# Patient Record
Sex: Female | Born: 1935 | Race: White | Hispanic: No | Marital: Married | Smoking: Former smoker
Health system: Southern US, Community
[De-identification: ages and names within clinical notes are randomized; demographics above are authoritative.]

## PROBLEM LIST (undated history)

## (undated) DIAGNOSIS — I358 Other nonrheumatic aortic valve disorders: Secondary | ICD-10-CM

## (undated) DIAGNOSIS — I4891 Unspecified atrial fibrillation: Secondary | ICD-10-CM

## (undated) DIAGNOSIS — I340 Nonrheumatic mitral (valve) insufficiency: Secondary | ICD-10-CM

## (undated) DIAGNOSIS — R9389 Abnormal findings on diagnostic imaging of other specified body structures: Secondary | ICD-10-CM

## (undated) DIAGNOSIS — IMO0002 Reserved for concepts with insufficient information to code with codable children: Secondary | ICD-10-CM

## (undated) DIAGNOSIS — Z9049 Acquired absence of other specified parts of digestive tract: Secondary | ICD-10-CM

## (undated) DIAGNOSIS — I10 Essential (primary) hypertension: Secondary | ICD-10-CM

## (undated) DIAGNOSIS — M069 Rheumatoid arthritis, unspecified: Secondary | ICD-10-CM

## (undated) DIAGNOSIS — S065X9A Traumatic subdural hemorrhage with loss of consciousness of unspecified duration, initial encounter: Secondary | ICD-10-CM

## (undated) DIAGNOSIS — I351 Nonrheumatic aortic (valve) insufficiency: Secondary | ICD-10-CM

## (undated) DIAGNOSIS — R9430 Abnormal result of cardiovascular function study, unspecified: Secondary | ICD-10-CM

## (undated) DIAGNOSIS — M359 Systemic involvement of connective tissue, unspecified: Secondary | ICD-10-CM

## (undated) DIAGNOSIS — S065XAA Traumatic subdural hemorrhage with loss of consciousness status unknown, initial encounter: Secondary | ICD-10-CM

## (undated) DIAGNOSIS — E039 Hypothyroidism, unspecified: Secondary | ICD-10-CM

## (undated) DIAGNOSIS — I071 Rheumatic tricuspid insufficiency: Secondary | ICD-10-CM

## (undated) DIAGNOSIS — Z7901 Long term (current) use of anticoagulants: Secondary | ICD-10-CM

## (undated) HISTORY — PX: HAND SURGERY: SHX662

## (undated) HISTORY — DX: Unspecified atrial fibrillation: I48.91

## (undated) HISTORY — DX: Nonrheumatic mitral (valve) insufficiency: I34.0

## (undated) HISTORY — DX: Rheumatoid arthritis, unspecified: M06.9

## (undated) HISTORY — DX: Long term (current) use of anticoagulants: Z79.01

## (undated) HISTORY — DX: Rheumatic tricuspid insufficiency: I07.1

## (undated) HISTORY — DX: Traumatic subdural hemorrhage with loss of consciousness of unspecified duration, initial encounter: S06.5X9A

## (undated) HISTORY — DX: Nonrheumatic aortic (valve) insufficiency: I35.1

## (undated) HISTORY — DX: Acquired absence of other specified parts of digestive tract: Z90.49

## (undated) HISTORY — DX: Essential (primary) hypertension: I10

## (undated) HISTORY — DX: Other nonrheumatic aortic valve disorders: I35.8

## (undated) HISTORY — DX: Abnormal result of cardiovascular function study, unspecified: R94.30

## (undated) HISTORY — DX: Hypothyroidism, unspecified: E03.9

## (undated) HISTORY — DX: Abnormal findings on diagnostic imaging of other specified body structures: R93.89

## (undated) HISTORY — DX: Traumatic subdural hemorrhage with loss of consciousness status unknown, initial encounter: S06.5XAA

## (undated) HISTORY — PX: SKIN CANCER EXCISION: SHX779

## (undated) HISTORY — PX: TOTAL ABDOMINAL HYSTERECTOMY: SHX209

## (undated) HISTORY — PX: CHOLECYSTECTOMY: SHX55

## (undated) HISTORY — DX: Reserved for concepts with insufficient information to code with codable children: IMO0002

---

## 1998-02-20 ENCOUNTER — Ambulatory Visit (HOSPITAL_COMMUNITY): Admission: RE | Admit: 1998-02-20 | Discharge: 1998-02-20 | Payer: Self-pay | Admitting: *Deleted

## 1999-02-25 ENCOUNTER — Encounter: Payer: Self-pay | Admitting: Family Medicine

## 1999-02-25 ENCOUNTER — Ambulatory Visit (HOSPITAL_COMMUNITY): Admission: RE | Admit: 1999-02-25 | Discharge: 1999-02-25 | Payer: Self-pay | Admitting: Family Medicine

## 1999-03-03 ENCOUNTER — Encounter: Payer: Self-pay | Admitting: Endocrinology

## 1999-03-03 ENCOUNTER — Inpatient Hospital Stay (HOSPITAL_COMMUNITY): Admission: EM | Admit: 1999-03-03 | Discharge: 1999-03-06 | Payer: Self-pay | Admitting: Emergency Medicine

## 1999-03-07 ENCOUNTER — Ambulatory Visit (HOSPITAL_COMMUNITY): Admission: RE | Admit: 1999-03-07 | Discharge: 1999-03-07 | Payer: Self-pay | Admitting: Infectious Diseases

## 1999-04-09 ENCOUNTER — Ambulatory Visit (HOSPITAL_COMMUNITY): Admission: RE | Admit: 1999-04-09 | Discharge: 1999-04-09 | Payer: Self-pay | Admitting: *Deleted

## 1999-09-23 ENCOUNTER — Encounter: Payer: Self-pay | Admitting: Family Medicine

## 1999-09-23 ENCOUNTER — Encounter: Admission: RE | Admit: 1999-09-23 | Discharge: 1999-09-23 | Payer: Self-pay | Admitting: Family Medicine

## 2000-02-27 ENCOUNTER — Ambulatory Visit (HOSPITAL_COMMUNITY): Admission: RE | Admit: 2000-02-27 | Discharge: 2000-02-27 | Payer: Self-pay | Admitting: Family Medicine

## 2000-02-27 ENCOUNTER — Encounter: Payer: Self-pay | Admitting: Family Medicine

## 2001-02-19 ENCOUNTER — Encounter: Payer: Self-pay | Admitting: General Surgery

## 2001-02-22 ENCOUNTER — Encounter: Payer: Self-pay | Admitting: General Surgery

## 2001-02-22 ENCOUNTER — Inpatient Hospital Stay (HOSPITAL_COMMUNITY): Admission: RE | Admit: 2001-02-22 | Discharge: 2001-02-23 | Payer: Self-pay | Admitting: General Surgery

## 2001-02-22 ENCOUNTER — Encounter (INDEPENDENT_AMBULATORY_CARE_PROVIDER_SITE_OTHER): Payer: Self-pay | Admitting: Specialist

## 2001-03-08 ENCOUNTER — Encounter: Payer: Self-pay | Admitting: Family Medicine

## 2001-03-08 ENCOUNTER — Ambulatory Visit (HOSPITAL_COMMUNITY): Admission: RE | Admit: 2001-03-08 | Discharge: 2001-03-08 | Payer: Self-pay | Admitting: Family Medicine

## 2001-08-20 ENCOUNTER — Other Ambulatory Visit: Admission: RE | Admit: 2001-08-20 | Discharge: 2001-08-20 | Payer: Self-pay | Admitting: Family Medicine

## 2001-12-02 ENCOUNTER — Ambulatory Visit (HOSPITAL_COMMUNITY): Admission: RE | Admit: 2001-12-02 | Discharge: 2001-12-02 | Payer: Self-pay | Admitting: General Surgery

## 2001-12-02 ENCOUNTER — Encounter (INDEPENDENT_AMBULATORY_CARE_PROVIDER_SITE_OTHER): Payer: Self-pay

## 2002-03-18 ENCOUNTER — Encounter: Payer: Self-pay | Admitting: Family Medicine

## 2002-03-18 ENCOUNTER — Ambulatory Visit (HOSPITAL_COMMUNITY): Admission: RE | Admit: 2002-03-18 | Discharge: 2002-03-18 | Payer: Self-pay | Admitting: Family Medicine

## 2003-03-20 ENCOUNTER — Encounter: Payer: Self-pay | Admitting: Internal Medicine

## 2003-03-20 ENCOUNTER — Ambulatory Visit (HOSPITAL_COMMUNITY): Admission: RE | Admit: 2003-03-20 | Discharge: 2003-03-20 | Payer: Self-pay | Admitting: Internal Medicine

## 2004-04-17 ENCOUNTER — Ambulatory Visit (HOSPITAL_COMMUNITY): Admission: RE | Admit: 2004-04-17 | Discharge: 2004-04-17 | Payer: Self-pay | Admitting: Internal Medicine

## 2004-05-01 ENCOUNTER — Ambulatory Visit: Payer: Self-pay | Admitting: *Deleted

## 2004-05-09 ENCOUNTER — Ambulatory Visit: Payer: Self-pay | Admitting: Cardiology

## 2004-05-17 ENCOUNTER — Ambulatory Visit: Payer: Self-pay | Admitting: Cardiology

## 2004-05-27 ENCOUNTER — Ambulatory Visit: Payer: Self-pay | Admitting: Internal Medicine

## 2004-06-12 ENCOUNTER — Ambulatory Visit: Payer: Self-pay | Admitting: Cardiology

## 2004-07-03 ENCOUNTER — Ambulatory Visit: Payer: Self-pay | Admitting: Cardiology

## 2004-07-31 ENCOUNTER — Ambulatory Visit: Payer: Self-pay | Admitting: Cardiology

## 2004-08-14 ENCOUNTER — Ambulatory Visit: Payer: Self-pay | Admitting: Cardiovascular Disease

## 2004-09-11 ENCOUNTER — Ambulatory Visit: Payer: Self-pay | Admitting: *Deleted

## 2004-10-07 ENCOUNTER — Ambulatory Visit: Payer: Self-pay | Admitting: Cardiology

## 2004-10-16 ENCOUNTER — Ambulatory Visit: Payer: Self-pay | Admitting: Internal Medicine

## 2004-10-23 ENCOUNTER — Ambulatory Visit: Payer: Self-pay | Admitting: Cardiology

## 2004-10-30 ENCOUNTER — Ambulatory Visit: Payer: Self-pay | Admitting: Cardiology

## 2004-11-07 ENCOUNTER — Ambulatory Visit: Payer: Self-pay | Admitting: Internal Medicine

## 2004-11-07 ENCOUNTER — Ambulatory Visit: Payer: Self-pay | Admitting: Cardiology

## 2004-11-28 ENCOUNTER — Ambulatory Visit: Payer: Self-pay | Admitting: Internal Medicine

## 2004-11-29 ENCOUNTER — Ambulatory Visit: Payer: Self-pay | Admitting: Family Medicine

## 2004-12-04 ENCOUNTER — Ambulatory Visit: Payer: Self-pay | Admitting: Cardiology

## 2004-12-20 ENCOUNTER — Ambulatory Visit: Payer: Self-pay | Admitting: Cardiology

## 2004-12-25 ENCOUNTER — Ambulatory Visit: Payer: Self-pay | Admitting: Cardiology

## 2005-01-08 ENCOUNTER — Ambulatory Visit: Payer: Self-pay | Admitting: Cardiology

## 2005-01-08 ENCOUNTER — Ambulatory Visit: Payer: Self-pay | Admitting: Internal Medicine

## 2005-01-14 ENCOUNTER — Ambulatory Visit: Payer: Self-pay | Admitting: Cardiology

## 2005-01-29 ENCOUNTER — Ambulatory Visit: Payer: Self-pay | Admitting: Internal Medicine

## 2005-01-29 ENCOUNTER — Ambulatory Visit: Payer: Self-pay | Admitting: *Deleted

## 2005-02-26 ENCOUNTER — Ambulatory Visit: Payer: Self-pay | Admitting: *Deleted

## 2005-03-13 ENCOUNTER — Ambulatory Visit: Payer: Self-pay | Admitting: Internal Medicine

## 2005-03-17 ENCOUNTER — Ambulatory Visit: Payer: Self-pay | Admitting: Internal Medicine

## 2005-04-08 ENCOUNTER — Ambulatory Visit: Payer: Self-pay | Admitting: Cardiology

## 2005-04-23 ENCOUNTER — Ambulatory Visit: Payer: Self-pay | Admitting: Cardiology

## 2005-04-28 ENCOUNTER — Ambulatory Visit: Payer: Self-pay | Admitting: Internal Medicine

## 2005-05-06 ENCOUNTER — Ambulatory Visit (HOSPITAL_COMMUNITY): Admission: RE | Admit: 2005-05-06 | Discharge: 2005-05-06 | Payer: Self-pay | Admitting: Internal Medicine

## 2005-05-21 ENCOUNTER — Ambulatory Visit: Payer: Self-pay | Admitting: Cardiology

## 2005-06-20 ENCOUNTER — Ambulatory Visit: Payer: Self-pay | Admitting: Internal Medicine

## 2005-07-21 ENCOUNTER — Ambulatory Visit: Payer: Self-pay | Admitting: Internal Medicine

## 2006-05-29 ENCOUNTER — Ambulatory Visit (HOSPITAL_COMMUNITY): Admission: RE | Admit: 2006-05-29 | Discharge: 2006-05-29 | Payer: Self-pay | Admitting: Obstetrics and Gynecology

## 2007-05-21 ENCOUNTER — Ambulatory Visit: Payer: Self-pay | Admitting: Family Medicine

## 2007-05-21 DIAGNOSIS — N390 Urinary tract infection, site not specified: Secondary | ICD-10-CM | POA: Insufficient documentation

## 2007-05-21 LAB — CONVERTED CEMR LAB
Bilirubin Urine: NEGATIVE
Glucose, Urine, Semiquant: NEGATIVE
Nitrite: NEGATIVE
Protein, U semiquant: 30
Specific Gravity, Urine: 1.025
Urobilinogen, UA: NEGATIVE
pH: 6

## 2007-06-08 ENCOUNTER — Ambulatory Visit (HOSPITAL_COMMUNITY): Admission: RE | Admit: 2007-06-08 | Discharge: 2007-06-08 | Payer: Self-pay | Admitting: Obstetrics and Gynecology

## 2008-06-14 ENCOUNTER — Ambulatory Visit (HOSPITAL_COMMUNITY): Admission: RE | Admit: 2008-06-14 | Discharge: 2008-06-14 | Payer: Self-pay | Admitting: Obstetrics and Gynecology

## 2009-06-15 ENCOUNTER — Encounter: Payer: Self-pay | Admitting: Obstetrics and Gynecology

## 2009-06-15 ENCOUNTER — Emergency Department (HOSPITAL_COMMUNITY): Admission: EM | Admit: 2009-06-15 | Discharge: 2009-06-15 | Payer: Self-pay | Admitting: Family Medicine

## 2009-06-15 ENCOUNTER — Inpatient Hospital Stay (HOSPITAL_COMMUNITY): Admission: EM | Admit: 2009-06-15 | Discharge: 2009-06-19 | Payer: Self-pay | Admitting: Emergency Medicine

## 2009-12-18 IMAGING — CT CT HEAD W/O CM
1 of 2 series · 13 of 30 positions shown, 17 images · non-contrast
Comparison: 06/15/2009.

CLINICAL DATA: 76-year-old female with abruptly decrease mental
status.  Status post fall with subdural hematoma.

CT HEAD WITHOUT CONTRAST
TECHNIQUE: Contiguous axial images were obtained from the base of
the skull through the vertex without contrast.

[Series 2: brain · axial · 0.47mm/px · z∈[+140,+273]mm · 13 of 32 slices shown, 17 images]
[im 3/32  brain]
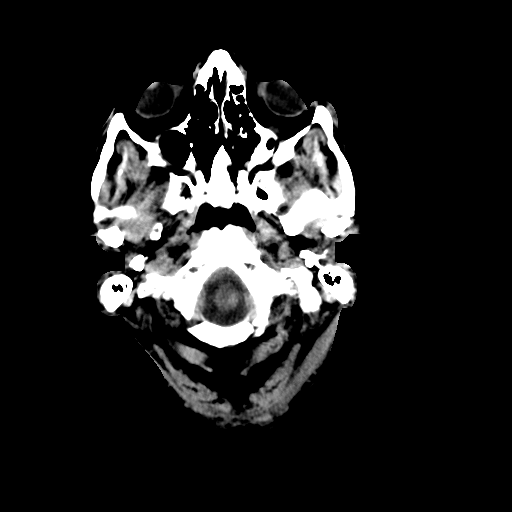
[im 3/32  bone]
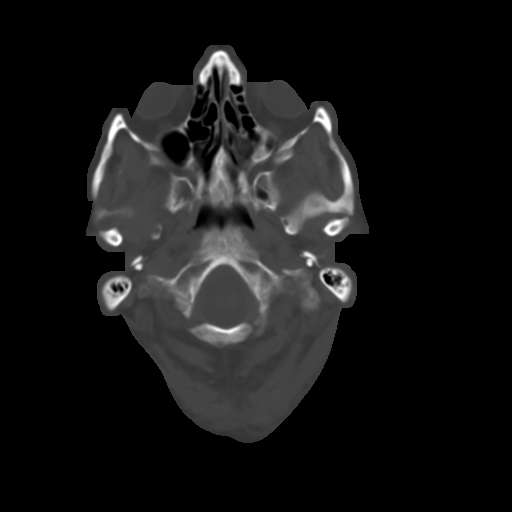
[im 5/32  brain]
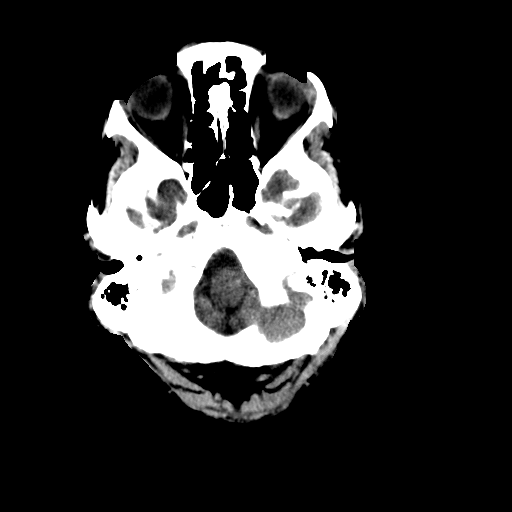
[im 7/32  brain]
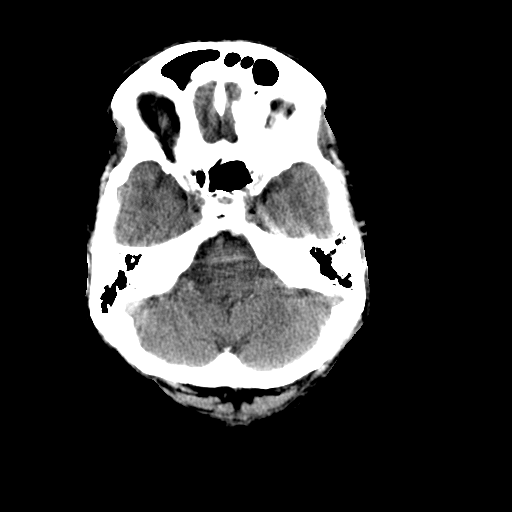
[im 9/32  brain]
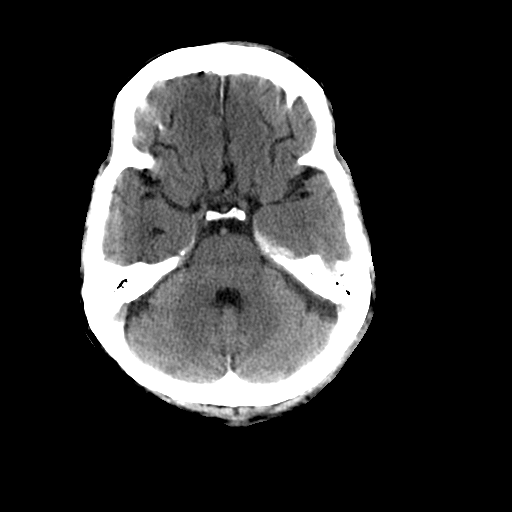
[im 12/32  brain]
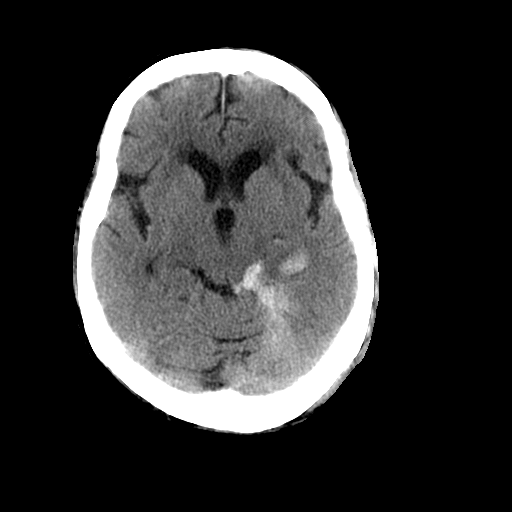
[im 12/32  bone]
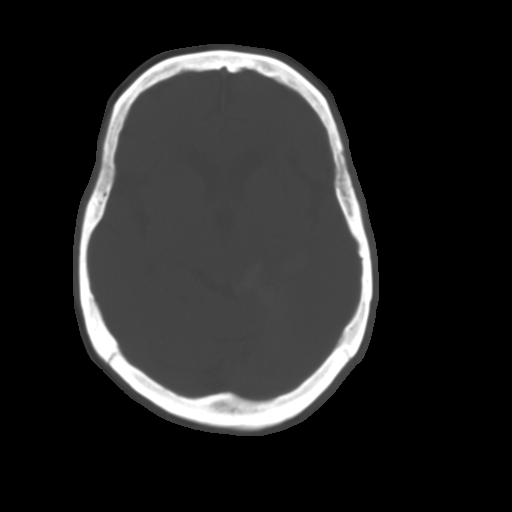
[im 14/32  brain]
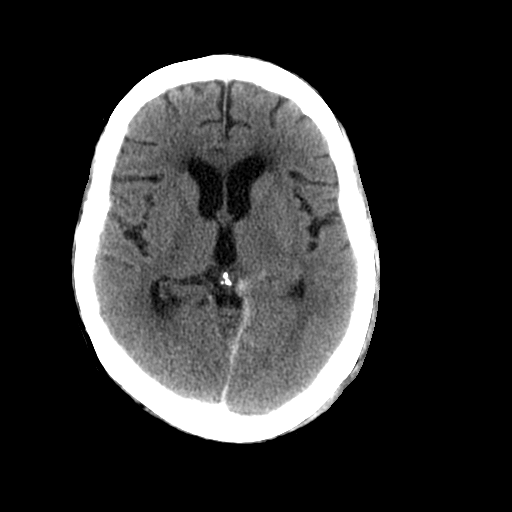
[im 16/32  brain]
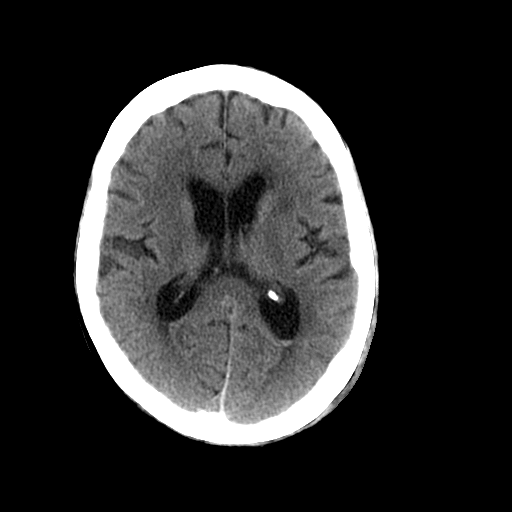
[im 18/32  brain]
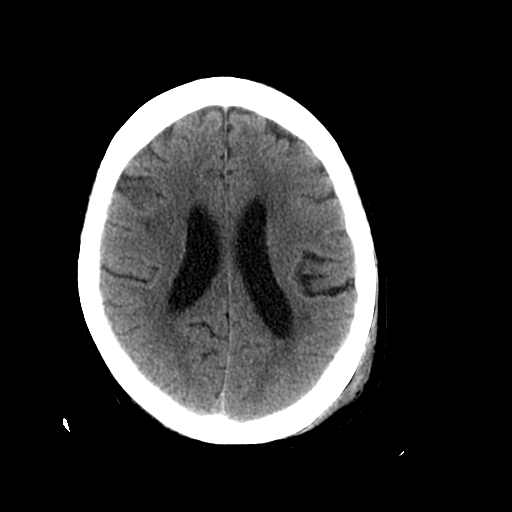
[im 20/32  brain]
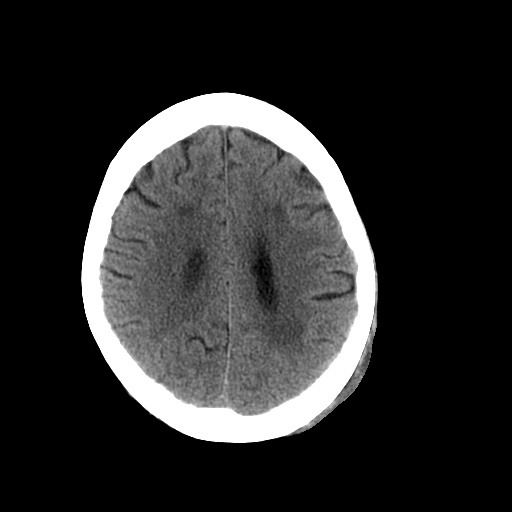
[im 20/32  bone]
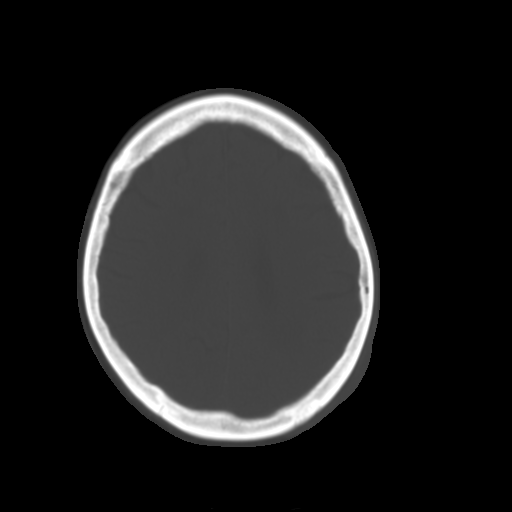
[im 23/32  brain]
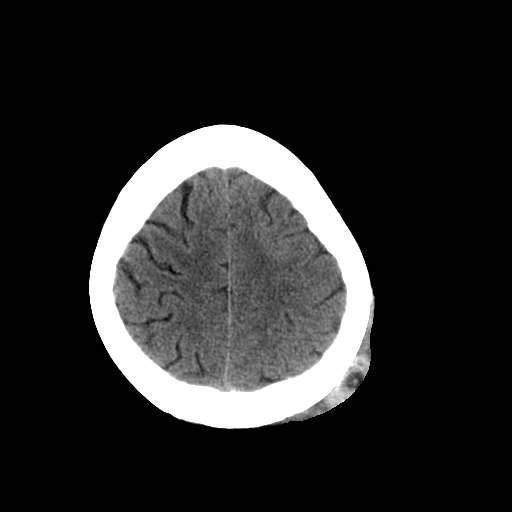
[im 25/32  brain]
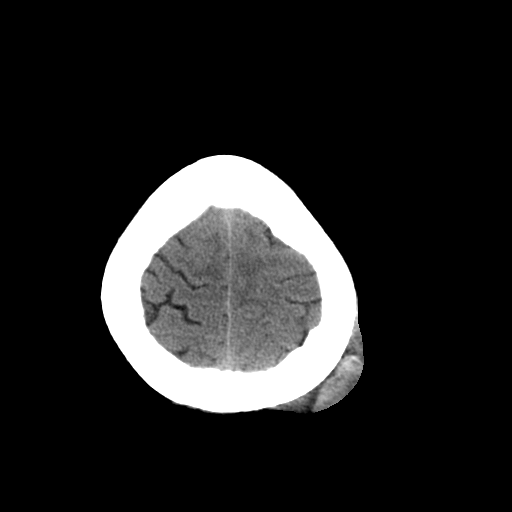
[im 27/32  brain]
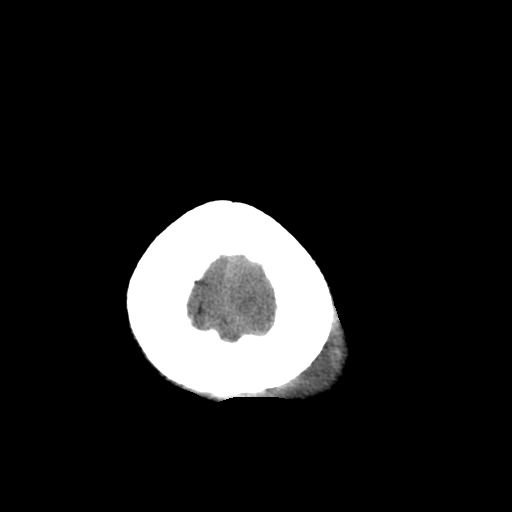
[im 29/32  brain]
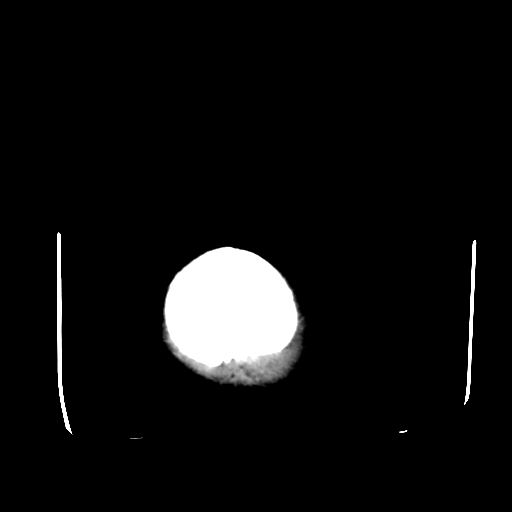
[im 29/32  bone]
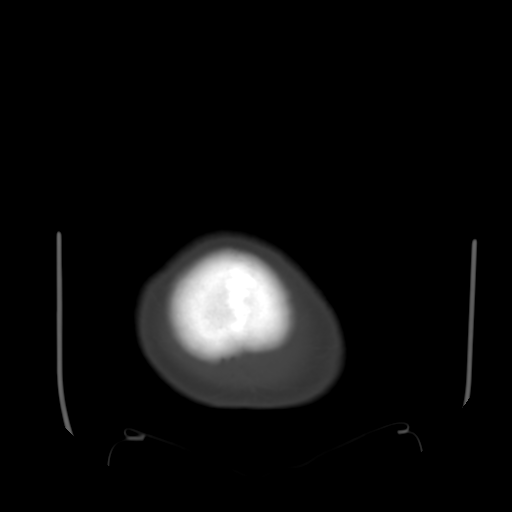

[13 of 30 positions shown; findings below may reference images not displayed]

FINDINGS: Stable multi focal moderate to large left side scalp
hematoma.  Paranasal sinuses and mastoids remain clear. No acute
osseous abnormality identified.

Volume of hyperdense subdural hematoma along the left tentorium,
the medial left middle cranial fossa, and posterior
interhemispheric fissure is not significantly changed.  A small
amount of superimposed subarachnoid hemorrhage is re-identified in
the ambient cisterns (more so the left), with a stable small volume
of intraventricular hemorrhage on the left.  There is superimposed
parenchymal hemorrhage in the left mesial temporal lobe which also
appears not significantly changed.  The latter does have mild
surrounding edema.  No midline shift or significant mass effect.
Stable ventricle size and configuration.  Stable gray-white matter
differentiation throughout the brain.  No evidence of cortically
based acute infarction identified.
IMPRESSION: 1.  No significant interval change. No new abnormality identified.
2.  Left side dominant subdural and subarachnoid hemorrhage with
stable parenchymal hemorrhage and surrounding edema in the mesial
left temporal lobe, and trace intraventricular hemorrhage.
3.  Stable scalp hematomas.

## 2010-07-18 ENCOUNTER — Other Ambulatory Visit (HOSPITAL_COMMUNITY): Payer: Self-pay | Admitting: Obstetrics and Gynecology

## 2010-07-18 DIAGNOSIS — Z0000 Encounter for general adult medical examination without abnormal findings: Secondary | ICD-10-CM

## 2010-07-18 DIAGNOSIS — Z1231 Encounter for screening mammogram for malignant neoplasm of breast: Secondary | ICD-10-CM

## 2010-08-08 ENCOUNTER — Ambulatory Visit (HOSPITAL_COMMUNITY)
Admission: RE | Admit: 2010-08-08 | Discharge: 2010-08-08 | Disposition: A | Discharge status: Home / Self Care | Payer: Medicare Other | Source: Ambulatory Visit | Attending: Obstetrics and Gynecology | Admitting: Obstetrics and Gynecology

## 2010-08-08 ENCOUNTER — Ambulatory Visit (HOSPITAL_COMMUNITY): Admission: RE | Admit: 2010-08-08 | Payer: Self-pay | Source: Home / Self Care | Admitting: Obstetrics and Gynecology

## 2010-08-08 DIAGNOSIS — Z1231 Encounter for screening mammogram for malignant neoplasm of breast: Secondary | ICD-10-CM

## 2010-09-30 LAB — POCT I-STAT, CHEM 8
BUN: 15 mg/dL (ref 6–23)
Calcium, Ion: 0.87 mmol/L — ABNORMAL LOW (ref 1.12–1.32)
Chloride: 105 mEq/L (ref 96–112)
Creatinine, Ser: 0.8 mg/dL (ref 0.4–1.2)
Glucose, Bld: 121 mg/dL — ABNORMAL HIGH (ref 70–99)
HCT: 40 % (ref 36.0–46.0)
Hemoglobin: 13.6 g/dL (ref 12.0–15.0)
Potassium: 3.9 mEq/L (ref 3.5–5.1)
Sodium: 138 mEq/L (ref 135–145)
TCO2: 30 mmol/L (ref 0–100)

## 2010-09-30 LAB — PREPARE FRESH FROZEN PLASMA

## 2010-09-30 LAB — DIFFERENTIAL
Basophils Absolute: 0.1 10*3/uL (ref 0.0–0.1)
Basophils Relative: 1 % (ref 0–1)
Eosinophils Absolute: 0 10*3/uL (ref 0.0–0.7)
Eosinophils Relative: 0 % (ref 0–5)
Lymphocytes Relative: 23 % (ref 12–46)
Lymphs Abs: 1.5 10*3/uL (ref 0.7–4.0)
Monocytes Absolute: 0.8 10*3/uL (ref 0.1–1.0)
Monocytes Relative: 12 % (ref 3–12)
Neutro Abs: 4.2 10*3/uL (ref 1.7–7.7)
Neutrophils Relative %: 64 % (ref 43–77)

## 2010-09-30 LAB — TYPE AND SCREEN
ABO/RH(D): O POS
Antibody Screen: NEGATIVE

## 2010-09-30 LAB — URINALYSIS, ROUTINE W REFLEX MICROSCOPIC
Bilirubin Urine: NEGATIVE
Glucose, UA: NEGATIVE mg/dL
Hgb urine dipstick: NEGATIVE
Ketones, ur: 15 mg/dL — AB
Nitrite: NEGATIVE
Protein, ur: NEGATIVE mg/dL
Specific Gravity, Urine: 1.013 (ref 1.005–1.030)
Urobilinogen, UA: 0.2 mg/dL (ref 0.0–1.0)
pH: 7 (ref 5.0–8.0)

## 2010-09-30 LAB — URINE MICROSCOPIC-ADD ON

## 2010-09-30 LAB — MRSA PCR SCREENING: MRSA by PCR: NEGATIVE

## 2010-09-30 LAB — CBC
HCT: 35.2 % — ABNORMAL LOW (ref 36.0–46.0)
Hemoglobin: 12.1 g/dL (ref 12.0–15.0)
MCHC: 34.4 g/dL (ref 30.0–36.0)
MCV: 93.2 fL (ref 78.0–100.0)
Platelets: 143 10*3/uL — ABNORMAL LOW (ref 150–400)
RBC: 3.77 MIL/uL — ABNORMAL LOW (ref 3.87–5.11)
RDW: 13.9 % (ref 11.5–15.5)
WBC: 6.6 10*3/uL (ref 4.0–10.5)

## 2010-09-30 LAB — BASIC METABOLIC PANEL
BUN: 6 mg/dL (ref 6–23)
CO2: 28 mEq/L (ref 19–32)
Calcium: 8.5 mg/dL (ref 8.4–10.5)
Chloride: 105 mEq/L (ref 96–112)
Creatinine, Ser: 0.68 mg/dL (ref 0.4–1.2)
GFR calc Af Amer: >60 mL/min (ref >60)
GFR calc non Af Amer: >60 mL/min (ref >60)
Glucose, Bld: 143 mg/dL — ABNORMAL HIGH (ref 70–99)
Potassium: 3.8 mEq/L (ref 3.5–5.1)
Sodium: 141 mEq/L (ref 135–145)

## 2010-09-30 LAB — PROTIME-INR
INR: 0.97 (ref 0.00–1.49)
INR: 1.18 (ref 0.00–1.49)
INR: 2.18 — ABNORMAL HIGH (ref 0.00–1.49)
Prothrombin Time: 14.9 seconds (ref 11.6–15.2)
Prothrombin Time: 24.1 seconds — ABNORMAL HIGH (ref 11.6–15.2)

## 2010-09-30 LAB — APTT: aPTT: 33 seconds (ref 24–37)

## 2010-09-30 LAB — ABO/RH: ABO/RH(D): O POS

## 2010-11-15 NOTE — Op Note (Signed)
[facility]  Patient:    Marissa Jackson, Marissa Jackson Visit Number: [id number] MRN: [id number]          Service Type: SUR Location: [id number] Attending Physician:  Marissa Jackson Proc. Date: 02/22/01 Adm. Date:  02/22/2001 Disc. Date: 02/23/2001   CC:         Marissa Jackson, M.D. [facility], [facility]   Operative Report  PREOPERATIVE DIAGNOSIS:  Acute and chronic cholecystolithiasis.  POSTOPERATIVE DIAGNOSIS:  Acute and chronic cholecystolithiasis.  OPERATION:  Laparoscopic cholecystectomy and operative cholangiogram.  SURGEON:  Marissa Jackson, M.D.  ASSISTANT:  Marissa Jackson. Marissa Jackson, M.D.  ANESTHESIA:  CRNA supervised Marissa Jackson, M.D.  INDICATIONS:  Marissa Jackson is a healthy 77 year old woman with significant cardiovascular disease, hypertension, atrial fibrillation, and anticoagulation.  She has had known gallstones.  She has delayed surgery.  She had acute exacerbation last week and decided to proceed with surgery as was recommended.  Her Coumadin was adjusted.  Her laboratory data today are satisfactory, and she is prepared urgently for this surgery.  She agrees and understands.  DESCRIPTION OF PROCEDURE:  She was taken to the operating room and general endotracheal anesthesia administered.  The abdomen was prepped and draped in the usual fashion.  Marcaine 0.5% was used prior to each incision and puncture.  An infraumbilical incision was made, the fascia identified, opened vertically, and the peritoneum entered without complication.  Hasson catheter placed and tied in place with a #1 Vicryl, the abdomen insufflated.  General peritoneoscopy revealed a large, thickened, and distended gallbladder.  There were adhesions in the lower abdomen, and they seemed uncomplicated.  A second 10 mm trocar was placed in the mid epigastrium, two 5 mm trocars in the right upper quadrant.  The gallbladder was grasped at its dome, and a suction device was  inserted, and thick bile was aspirated so that the gallbladder could now be grasped.  The omental adhesions were taken down bluntly.  The gallbladder was placed on tension.  It was quite elongated.  The entire gallbladder was inflamed.  With the gallbladder grasped and appropriately placed on tension, the fatty tissue of the base of the gallbladder was carefully dissected away, and the cystic duct and artery were carefully identified, dissected free.  A clip was placed near the gallbladder on the cystic duct.  The cystic duct was opened.  The catheter was inserted, and real-time fluoroscopy was used to inject the biliary system.  There was quick and free flow of bile into the duodenum.  The system did not seem distended nor did I see retained stones. With this, the catheter was removed.  The cystic duct stump was triply clipped and subsequently divided.  The artery was dissected, triply clipped, and divided.  The gallbladder was then removed from the gallbladder bed without complication, though it was quite edematous and difficult to dissect.  The gallbladder was released from the liver bed, placed in an EndoCatch bag, and then removed through the infraumbilical incision after removal of the stones. That incision was closed and tied closed with a #1 Vicryl.  Copious irrigation was carried out.  There appeared to be no complications.  There was no blood and no bile leak.  Irrigant was clear.  All irrigant, CO2, instruments, and trocars were removed.  The skin incision was closed with 3-0 Monocryl. Steri-Strips and dry sterile dressings applied.  Final counts correct.  She tolerated it well and was removed to the recovery room in good condition.  Attending Physician:  Marissa Jackson DD:  02/22/01 TD:  02/23/01 Job: [id number]

## 2010-11-15 NOTE — Op Note (Signed)
[facility]  Patient:    Marissa Jackson, Marissa Jackson Visit Number: [id number] MRN: [id number]          Service Type: DSU Location: DAY Attending Physician:  Carson Myrtle Dictated by:   Sheppard Plumber Earlene Plater, M.D. Proc. Date: 12/02/01 Admit Date:  12/02/2001   CC:         Luis Abed, M.D. [facility], [facility]  Aundra Dubin, M.D.   Operative Report  PREOPERATIVE DIAGNOSIS:  Mass, right neck.  POSTOPERATIVE DIAGNOSIS:  Mass, right neck, probable inflammatory.  OPERATIVE PROCEDURE:  Excision of mass, right neck.  SURGEON:  Timothy E. Earlene Plater, M.D.  ANESTHESIA:  General.  INDICATION:  [name] is well known to me, 82, basically stable health with known atrial fibrillation, hypertension, rheumatoid arthritis, chronic Coumadinization, but all controlled and stable.  She developed a neck in the right neck under the right mandible approximately two months ago.  It has been persistent.  X-rays have been made.  CTs made.  She has seen a dentist, an Transport planner, and ENT doctor.  A needle biopsy was done, was nondiagnostic and, because of its persistence, pain, and concern for diagnosis, she wishes to have it removed.  She and I have carefully discussed this, and we planned this procedure at her wish for diagnosis.  She has a 2 cm, firm, fixed mass, slightly tender to palpation in the soft tissue just inferior to the right mandible.  It is moveable.  DESCRIPTION OF PROCEDURE:  The patient was prepared at home.  Coumadin had been stopped.  She was evaluated by anesthesia this morning.  A PTT and PT were normal this morning.  She was taken to the operating room, placed supine, general endotracheal anesthesia administered.  She was carefully positioned, cushioned, and padded and the right neck exposed.  It was prepped and draped in the usual fashion. Marcaine 0.25% with epinephrine was used around the palpable mass.  A straight incision was made over an  existing skin crease.  The mass was immediately identifiable in the superficial subcu and deep subcu.  It was carefully dissected around and about, care taken to include the entire mass.  It was adherent to the platysma muscle and was sharply dissected from the muscle.  It was removed and submitted for frozen section.  Bleeding was controlled.  The wound was perfectly dry, and it was closed in layers with 4-0 Monocryl. Steri-Strips and OpSite applied to the wound.  Counts were correct; she tolerated it well, was awakened, and taken to the recovery room in good condition.  Frozen section, Dr. Laureen Ochs, returned as inflammatory reaction only.  The patient was given written and verbal instructions.  She will be seen and followed as an outpatient. Dictated by:   Sheppard Plumber Earlene Plater, M.D. Attending Physician:  Carson Myrtle DD:  12/02/01 TD:  12/03/01 Job: [id number]

## 2011-06-06 ENCOUNTER — Encounter: Payer: Self-pay | Admitting: Cardiology

## 2011-07-23 ENCOUNTER — Other Ambulatory Visit (HOSPITAL_COMMUNITY): Payer: Self-pay | Admitting: Obstetrics and Gynecology

## 2011-07-23 DIAGNOSIS — Z1231 Encounter for screening mammogram for malignant neoplasm of breast: Secondary | ICD-10-CM

## 2011-09-02 ENCOUNTER — Ambulatory Visit (HOSPITAL_COMMUNITY)
Admission: RE | Admit: 2011-09-02 | Discharge: 2011-09-02 | Disposition: A | Discharge status: Home / Self Care | Payer: Medicare Other | Source: Ambulatory Visit | Attending: Obstetrics and Gynecology | Admitting: Obstetrics and Gynecology

## 2011-09-02 DIAGNOSIS — Z1231 Encounter for screening mammogram for malignant neoplasm of breast: Secondary | ICD-10-CM | POA: Insufficient documentation

## 2011-11-07 ENCOUNTER — Telehealth: Payer: Self-pay | Admitting: Cardiology

## 2011-11-07 NOTE — Telephone Encounter (Signed)
New msg Pt moved and now has moved back to [place] area. She said she has seen Dr Myrtis Ser in past and would like to start seeing him again. Please call her back

## 2011-11-07 NOTE — Telephone Encounter (Signed)
Per Melissa Hethcox, the patient has not been seen in over 3 years with Dr. Myrtis Ser. She wants to get back in to see him. Since she will be considered a new patient. I will forward to Debbie to review with Dr. Myrtis Ser if he will see her.

## 2011-11-11 NOTE — Telephone Encounter (Signed)
N/A.  LMTC. 

## 2011-11-11 NOTE — Telephone Encounter (Signed)
Pt notified. Appt scheduled

## 2011-11-11 NOTE — Telephone Encounter (Signed)
Okay to set up as a new patient evaluation

## 2011-12-07 ENCOUNTER — Encounter: Payer: Self-pay | Admitting: Cardiology

## 2011-12-07 DIAGNOSIS — E039 Hypothyroidism, unspecified: Secondary | ICD-10-CM | POA: Insufficient documentation

## 2011-12-07 DIAGNOSIS — M069 Rheumatoid arthritis, unspecified: Secondary | ICD-10-CM | POA: Insufficient documentation

## 2011-12-07 DIAGNOSIS — I4891 Unspecified atrial fibrillation: Secondary | ICD-10-CM | POA: Insufficient documentation

## 2011-12-07 DIAGNOSIS — S065X9A Traumatic subdural hemorrhage with loss of consciousness of unspecified duration, initial encounter: Secondary | ICD-10-CM | POA: Insufficient documentation

## 2011-12-07 DIAGNOSIS — S065XAA Traumatic subdural hemorrhage with loss of consciousness status unknown, initial encounter: Secondary | ICD-10-CM | POA: Insufficient documentation

## 2011-12-07 DIAGNOSIS — Z9049 Acquired absence of other specified parts of digestive tract: Secondary | ICD-10-CM | POA: Insufficient documentation

## 2011-12-09 ENCOUNTER — Encounter: Payer: Self-pay | Admitting: Cardiology

## 2011-12-09 ENCOUNTER — Ambulatory Visit (INDEPENDENT_AMBULATORY_CARE_PROVIDER_SITE_OTHER): Payer: Medicare Other | Admitting: Cardiology

## 2011-12-09 VITALS — BP 130/78 | HR 65 | Ht 63.0 in | Wt 142.0 lb

## 2011-12-09 DIAGNOSIS — Z7901 Long term (current) use of anticoagulants: Secondary | ICD-10-CM

## 2011-12-09 DIAGNOSIS — I4891 Unspecified atrial fibrillation: Secondary | ICD-10-CM

## 2011-12-09 DIAGNOSIS — S065XAA Traumatic subdural hemorrhage with loss of consciousness status unknown, initial encounter: Secondary | ICD-10-CM

## 2011-12-09 DIAGNOSIS — R918 Other nonspecific abnormal finding of lung field: Secondary | ICD-10-CM

## 2011-12-09 DIAGNOSIS — R9389 Abnormal findings on diagnostic imaging of other specified body structures: Secondary | ICD-10-CM

## 2011-12-09 DIAGNOSIS — I6200 Nontraumatic subdural hemorrhage, unspecified: Secondary | ICD-10-CM

## 2011-12-09 DIAGNOSIS — S065X9A Traumatic subdural hemorrhage with loss of consciousness of unspecified duration, initial encounter: Secondary | ICD-10-CM

## 2011-12-09 DIAGNOSIS — M069 Rheumatoid arthritis, unspecified: Secondary | ICD-10-CM

## 2011-12-09 DIAGNOSIS — I10 Essential (primary) hypertension: Secondary | ICD-10-CM

## 2011-12-09 DIAGNOSIS — E039 Hypothyroidism, unspecified: Secondary | ICD-10-CM

## 2011-12-09 NOTE — Assessment & Plan Note (Signed)
The patient showed me a chest x-ray report from 2000. There was question of an abnormality. We found a report of a study from 2002. In the report he said that it was compared to the study of 2000. It was felt that there was no significant abnormality. Therefore no further workup is needed at this time.

## 2011-12-09 NOTE — Progress Notes (Signed)
HPI  The patient is seen today to reestablish cardiology care.. I have seen her in the past. I looked extensively for old records. There is no information in the colder like chronic medical record. I was able to get some information from the old hospital chart in system. After speaking to the patient is clear that I follow for in the past for her atrial fibrillation. She had moved out of the area and then moved back. Her atrial fib is chronic. She has been on Coumadin long-term. She fell and had a CNS bleed in 2010. Coumadin was held while she recovered from this. Coumadin was then restarted and is followed carefully by her primary care team. She does not have any significant symptoms.  In the dental office recently her blood pressure was elevated. He was then elevated again on a second occasion. Today it is normal here.  No Known Allergies  Current Outpatient Prescriptions  Medication Sig Dispense Refill  . adalimumab (HUMIRA) 40 MG/0.8ML injection Inject 40 mg into the skin every 14 (fourteen) days.      Marland Kitchen diltiazem (CARDIZEM CD) 180 MG 24 hr capsule Take 180 mg by mouth daily.      Marland Kitchen estrogens, conjugated, (PREMARIN) 0.3 MG tablet Take 0.3 mg by mouth daily. Take daily for 21 days then do not take for 7 days.      Marland Kitchen ezetimibe-simvastatin (VYTORIN) 10-40 MG per tablet Take 1 tablet by mouth at bedtime.      . hydrochlorothiazide (HYDRODIURIL) 25 MG tablet Take 25 mg by mouth daily.      Marland Kitchen leflunomide (ARAVA) 20 MG tablet Take 20 mg by mouth daily.      Marland Kitchen levothyroxine (SYNTHROID, LEVOTHROID) 88 MCG tablet Take 88 mcg by mouth daily.      . metoprolol succinate (TOPROL-XL) 25 MG 24 hr tablet Take 50 mg by mouth 2 (two) times daily.      Marland Kitchen warfarin (COUMADIN) 2 MG tablet Take 2 mg by mouth as directed.        History   Social History  . Marital Status: Married    Spouse Name: N/A    Number of Children: 1  . Years of Education: N/A   Occupational History  . retired    Social History  Main Topics  . Smoking status: Former Smoker    Quit date: 07/01/1987  . Smokeless tobacco: Never Used  . Alcohol Use: 4.2 oz/week    7 Glasses of wine per week  . Drug Use: No  . Sexually Active: Not on file   Other Topics Concern  . Not on file   Social History Narrative  . No narrative on file    Family History  Problem Relation Age of Onset  . Heart failure    . Heart disease    . Diabetes    . Thyroid cancer Son     about 20 yrs ago    Past Medical History  Diagnosis Date  . Warfarin anticoagulation     Atrial fibrillation  //  held for a period of time 2010 when CNS bleed but then resumed  . Atrial fibrillation     Coumadin use in the past until CNS bleed  . Hypothyroidism   . Rheumatoid arthritis     History of surgical procedures  . S/P cholecystectomy     Cholecystectomy and hysterectomy  . Subdural hematoma     Patient fell 2010. She had a temporal hemorrhage and subdural hematoma. Coumadin  was reversed and the patient stabilized  . Abnormal chest x-ray     2000  //  repeat chest x-ray 2002 no significant abnormality    Past Surgical History  Procedure Date  . Hand surgery     x5  . Cholecystectomy '02  . Total abdominal hysterectomy '84  . Skin cancer excision     multiple    ROS    Patient denies fever, chills, headache, sweats, rash, change in vision, change in hearing, chest pain, cough, nausea vomiting, urinary symptoms. All other systems are reviewed and are negative.  PHYSICAL EXAM   She is quite stable. She is oriented to person time and place. Affect is normal. There is no jugulovenous distention. There no carotid bruits. Lungs are clear. Respiratory effort is nonlabored. Cardiac exam reveals S1 and S2. There no clicks or significant murmurs. The rhythm is irregularly irregular. The abdomen is soft. There is no peripheral edema. The patient has old changes of her rheumatoid arthritis. Some of these have been corrected surgically. There are no  skin rashes.  Filed Vitals:   12/09/11 0951  BP: 130/78  Pulse: 65  Height: 5\' 3"  (1.6 m)  Weight: 142 lb (64.411 kg)   EKG is done today and reviewed by me. There is atrial fibrillation. We know that this is old historically. The rate is controlled.  ASSESSMENT & PLAN

## 2011-12-09 NOTE — Patient Instructions (Signed)
Your physician has requested that you have an echocardiogram. Echocardiography is a painless test that uses sound waves to create images of your heart. It provides your doctor with information about the size and shape of your heart and how well your heart's chambers and valves are working. This procedure takes approximately one hour. There are no restrictions for this procedure.  Check your blood pressures randomly and write them down.  Bring the readings with you to your next visit.    Your physician recommends that you schedule a follow-up appointment in: Next Thursday if the echo has been done

## 2011-12-09 NOTE — Assessment & Plan Note (Signed)
The patient's thyroid status is treated. No further workup.

## 2011-12-09 NOTE — Assessment & Plan Note (Signed)
Atrial fibrillation is chronic. The rate is controlled. She is on Coumadin long term. The patient has not had a 2-D echo to reassess for many many years. This will be done to reassess her overall LV and RV function.

## 2011-12-09 NOTE — Assessment & Plan Note (Signed)
There has recently been a question of hypertension on 2 occasions. Blood pressures control today. I feel the medication should not be started at this point. She is reminded to be careful with her salt intake. I will suggest that she check her blood pressure at home and bring information. I will see her back on one further occasion to reassess all of her data including her echo.

## 2011-12-09 NOTE — Assessment & Plan Note (Signed)
The patient's rheumatoid arthritis is stable. She has had surgical procedures in the past to help correct some of the abnormalities.

## 2011-12-09 NOTE — Assessment & Plan Note (Signed)
Patient remains on Coumadin. This is followed carefully by her primary team. No further workup.

## 2011-12-09 NOTE — Assessment & Plan Note (Signed)
She had a CNS bleed in 2010 with a fall. Coumadin was held and then eventually was resumed. She is quite stable.

## 2011-12-18 ENCOUNTER — Ambulatory Visit (HOSPITAL_COMMUNITY): Payer: Medicare Other | Attending: Cardiology

## 2011-12-18 ENCOUNTER — Ambulatory Visit (INDEPENDENT_AMBULATORY_CARE_PROVIDER_SITE_OTHER): Payer: Medicare Other | Admitting: Cardiology

## 2011-12-18 ENCOUNTER — Encounter: Payer: Self-pay | Admitting: Cardiology

## 2011-12-18 VITALS — BP 132/70 | HR 65 | Ht 63.0 in | Wt 144.0 lb

## 2011-12-18 DIAGNOSIS — I359 Nonrheumatic aortic valve disorder, unspecified: Secondary | ICD-10-CM

## 2011-12-18 DIAGNOSIS — I4891 Unspecified atrial fibrillation: Secondary | ICD-10-CM

## 2011-12-18 DIAGNOSIS — I059 Rheumatic mitral valve disease, unspecified: Secondary | ICD-10-CM | POA: Insufficient documentation

## 2011-12-18 DIAGNOSIS — I071 Rheumatic tricuspid insufficiency: Secondary | ICD-10-CM

## 2011-12-18 DIAGNOSIS — I358 Other nonrheumatic aortic valve disorders: Secondary | ICD-10-CM | POA: Insufficient documentation

## 2011-12-18 DIAGNOSIS — I351 Nonrheumatic aortic (valve) insufficiency: Secondary | ICD-10-CM

## 2011-12-18 DIAGNOSIS — I10 Essential (primary) hypertension: Secondary | ICD-10-CM

## 2011-12-18 DIAGNOSIS — Z87898 Personal history of other specified conditions: Secondary | ICD-10-CM | POA: Insufficient documentation

## 2011-12-18 DIAGNOSIS — I079 Rheumatic tricuspid valve disease, unspecified: Secondary | ICD-10-CM

## 2011-12-18 DIAGNOSIS — Z87891 Personal history of nicotine dependence: Secondary | ICD-10-CM | POA: Insufficient documentation

## 2011-12-18 DIAGNOSIS — I517 Cardiomegaly: Secondary | ICD-10-CM | POA: Insufficient documentation

## 2011-12-18 DIAGNOSIS — R0989 Other specified symptoms and signs involving the circulatory and respiratory systems: Secondary | ICD-10-CM

## 2011-12-18 DIAGNOSIS — Z7901 Long term (current) use of anticoagulants: Secondary | ICD-10-CM

## 2011-12-18 DIAGNOSIS — IMO0002 Reserved for concepts with insufficient information to code with codable children: Secondary | ICD-10-CM | POA: Insufficient documentation

## 2011-12-18 DIAGNOSIS — R9430 Abnormal result of cardiovascular function study, unspecified: Secondary | ICD-10-CM | POA: Insufficient documentation

## 2011-12-18 DIAGNOSIS — I340 Nonrheumatic mitral (valve) insufficiency: Secondary | ICD-10-CM | POA: Insufficient documentation

## 2011-12-18 NOTE — Assessment & Plan Note (Signed)
Atrial fibrillation is stable. No change in therapy.

## 2011-12-18 NOTE — Patient Instructions (Addendum)
Your physician wants you to follow-up in: 1 year. You will receive a reminder letter in the mail two months in advance. If you don't receive a letter, please call our office to schedule the follow-up appointment.  

## 2011-12-18 NOTE — Assessment & Plan Note (Signed)
The patient has some mild valvular abnormalities. These are not causing problems at this time. No further workup or therapy is needed. I will follow this over time.

## 2011-12-18 NOTE — Progress Notes (Signed)
Echocardiogram performed.  

## 2011-12-18 NOTE — Assessment & Plan Note (Signed)
Her blood pressures are documented to be high normal in the morning before she takes her medications. There have been consideration given to starting her on amlodipine by her primary physician when recent pressures seem to be elevated. She has not started this yet. I have encouraged her to take more pressures and to show her primary physician all of the data. Then they can again decide if amlodipine is to be started. It is possible that she could have higher pressures at other times during the day than she has recorded up to this point.

## 2011-12-18 NOTE — Progress Notes (Signed)
HPI   Patient returns today to follow up atrial fibrillation and the question of hypertension. I saw her last on December 09, 2011. Purpose of that visit was to reestablish care. I had taken care of her in the past but she had moved away. She is now back in the area. She is very stable with her atrial fibrillation. Two-dimensional echo was done today and I personally reviewed it. There is normal LV function with an ejection fraction of 65%. She does have mild aortic insufficiency. There is mild to moderate mitral regurgitation and mild to moderate tricuspid regurgitation. I doubt that these will cause her any difficulties over time. She has only very slight elevation of the pulmonary pressure.  She's been checking her blood pressure at home. The systolic ranges from 135-140 in the morning before she takes her medicines. Her diastolic is ranging from 69-80.  No Known Allergies  Current Outpatient Prescriptions  Medication Sig Dispense Refill  . adalimumab (HUMIRA) 40 MG/0.8ML injection Inject 40 mg into the skin every 14 (fourteen) days.      Marland Kitchen diltiazem (CARDIZEM CD) 180 MG 24 hr capsule Take 180 mg by mouth daily.      Marland Kitchen estrogens, conjugated, (PREMARIN) 0.3 MG tablet Take 0.3 mg by mouth daily.       Marland Kitchen ezetimibe-simvastatin (VYTORIN) 10-40 MG per tablet Take 1 tablet by mouth at bedtime.      . hydrochlorothiazide (HYDRODIURIL) 25 MG tablet Take 25 mg by mouth daily.      Marland Kitchen leflunomide (ARAVA) 20 MG tablet Take 20 mg by mouth daily.      Marland Kitchen levothyroxine (SYNTHROID, LEVOTHROID) 88 MCG tablet Take 88 mcg by mouth daily.      . metoprolol succinate (TOPROL-XL) 25 MG 24 hr tablet Take 50 mg by mouth 2 (two) times daily.      Marland Kitchen warfarin (COUMADIN) 2 MG tablet Take 2 mg by mouth as directed.        History   Social History  . Marital Status: Married    Spouse Name: N/A    Number of Children: 1  . Years of Education: N/A   Occupational History  . retired    Social History Main Topics  .  Smoking status: Former Smoker    Quit date: 07/01/1987  . Smokeless tobacco: Never Used  . Alcohol Use: 4.2 oz/week    7 Glasses of wine per week  . Drug Use: No  . Sexually Active: Not on file   Other Topics Concern  . Not on file   Social History Narrative  . No narrative on file    Family History  Problem Relation Age of Onset  . Heart failure    . Heart disease    . Diabetes    . Thyroid cancer Son     about 20 yrs ago    Past Medical History  Diagnosis Date  . Warfarin anticoagulation     Atrial fibrillation  //  held for a period of time 2010 when CNS bleed but then resumed  . Atrial fibrillation     Coumadin use in the past until CNS bleed  . Hypothyroidism   . Rheumatoid arthritis     History of surgical procedures  . S/P cholecystectomy     Cholecystectomy and hysterectomy  . Subdural hematoma     Patient fell 2010. She had a temporal hemorrhage and subdural hematoma. Coumadin was reversed and the patient stabilized  . Abnormal chest x-ray  2000  //  repeat chest x-ray 2002 no significant abnormality  . Hypertension     Question of hypertension June, 2013  . Ejection fraction     EF 65%, echo, June, 2013  . Aortic valve sclerosis     Mild, echo, June, 2013  . Aortic insufficiency     Mild, echo, June, 2013  . Mitral regurgitation     Mild to moderate, echo, June, 2013  . Tricuspid regurgitation     Mild to moderate, echo, June, 2013, PA pressure 40 mm mercury    Past Surgical History  Procedure Date  . Hand surgery     x5  . Cholecystectomy '02  . Total abdominal hysterectomy '84  . Skin cancer excision     multiple    ROS   Patient denies fever, chills, headache, sweats, rash, change in vision, change in hearing, chest pain, cough, nausea vomiting, urinary symptoms. All other systems are reviewed and are negative.  PHYSICAL EXAM  Patient's vital signs are stable. Cardiac exam reveals S1 and S2. There no clicks or significant  murmurs.  Filed Vitals:   12/18/11 1141  BP: 132/70  Pulse: 65  Height: 5\' 3"  (1.6 m)  Weight: 144 lb (65.318 kg)     ASSESSMENT & PLAN

## 2011-12-18 NOTE — Assessment & Plan Note (Signed)
Her Coumadin had been held with her CNS bleed in 2010. When she recovered it was restarted and she should be maintained on it.

## 2012-08-18 ENCOUNTER — Other Ambulatory Visit (HOSPITAL_COMMUNITY): Payer: Self-pay | Admitting: Obstetrics and Gynecology

## 2012-08-18 DIAGNOSIS — Z1231 Encounter for screening mammogram for malignant neoplasm of breast: Secondary | ICD-10-CM

## 2012-09-07 ENCOUNTER — Ambulatory Visit (HOSPITAL_COMMUNITY)
Admission: RE | Admit: 2012-09-07 | Discharge: 2012-09-07 | Disposition: A | Discharge status: Home / Self Care | Payer: Medicare Other | Source: Ambulatory Visit | Attending: Obstetrics and Gynecology | Admitting: Obstetrics and Gynecology

## 2012-09-07 DIAGNOSIS — Z1231 Encounter for screening mammogram for malignant neoplasm of breast: Secondary | ICD-10-CM

## 2013-09-21 ENCOUNTER — Other Ambulatory Visit (HOSPITAL_COMMUNITY): Payer: Self-pay | Admitting: Obstetrics and Gynecology

## 2013-09-21 DIAGNOSIS — Z1231 Encounter for screening mammogram for malignant neoplasm of breast: Secondary | ICD-10-CM

## 2013-09-27 ENCOUNTER — Ambulatory Visit (HOSPITAL_COMMUNITY)
Admission: RE | Admit: 2013-09-27 | Discharge: 2013-09-27 | Disposition: A | Discharge status: Home / Self Care | Payer: Medicare Other | Source: Ambulatory Visit | Attending: Obstetrics and Gynecology | Admitting: Obstetrics and Gynecology

## 2013-09-27 DIAGNOSIS — Z1231 Encounter for screening mammogram for malignant neoplasm of breast: Secondary | ICD-10-CM

## 2013-09-29 ENCOUNTER — Ambulatory Visit (HOSPITAL_COMMUNITY): Payer: Medicare Other

## 2013-12-08 ENCOUNTER — Encounter (HOSPITAL_COMMUNITY): Payer: Self-pay | Admitting: Emergency Medicine

## 2013-12-08 ENCOUNTER — Emergency Department (INDEPENDENT_AMBULATORY_CARE_PROVIDER_SITE_OTHER)
Admission: EM | Admit: 2013-12-08 | Discharge: 2013-12-08 | Disposition: A | Discharge status: Home / Self Care | Payer: Medicare Other | Source: Home / Self Care | Attending: Family Medicine | Admitting: Family Medicine

## 2013-12-08 ENCOUNTER — Telehealth: Payer: Self-pay

## 2013-12-08 DIAGNOSIS — L03119 Cellulitis of unspecified part of limb: Secondary | ICD-10-CM

## 2013-12-08 DIAGNOSIS — L02419 Cutaneous abscess of limb, unspecified: Secondary | ICD-10-CM

## 2013-12-08 DIAGNOSIS — L03116 Cellulitis of left lower limb: Secondary | ICD-10-CM

## 2013-12-08 MED ORDER — DICLOXACILLIN SODIUM 500 MG PO CAPS: 500.0000 mg | ORAL_CAPSULE | Freq: Four times a day (QID) | ORAL | Status: AC

## 2013-12-08 NOTE — Discharge Instructions (Signed)
Cellulitis Cellulitis is an infection of the skin and the tissue beneath it. The infected area is usually red and tender. Cellulitis occurs most often in the arms and lower legs.  CAUSES  Cellulitis is caused by bacteria that enter the skin through cracks or cuts in the skin. The most common types of bacteria that cause cellulitis are Staphylococcus and Streptococcus. SYMPTOMS   Redness and warmth.  Swelling.  Tenderness or pain.  Fever. DIAGNOSIS  Your caregiver can usually determine what is wrong based on a physical exam. Blood tests may also be done. TREATMENT  Treatment usually involves taking an antibiotic medicine. HOME CARE INSTRUCTIONS   Take your antibiotics as directed. Finish them even if you start to feel better.  Keep the infected arm or leg elevated to reduce swelling.  Apply a warm cloth to the affected area up to 4 times per day to relieve pain.  Only take over-the-counter or prescription medicines for pain, discomfort, or fever as directed by your caregiver.  Keep all follow-up appointments as directed by your caregiver. SEEK MEDICAL CARE IF:   You notice red streaks coming from the infected area.  Your red area gets larger or turns dark in color.  Your bone or joint underneath the infected area becomes painful after the skin has healed.  Your infection returns in the same area or another area.  You notice a swollen bump in the infected area.  You develop new symptoms. SEEK IMMEDIATE MEDICAL CARE IF:   You have a fever.  You feel very sleepy.  You develop vomiting or diarrhea.  You have a general ill feeling (malaise) with muscle aches and pains. MAKE SURE YOU:   Understand these instructions.  Will watch your condition.  Will get help right away if you are not doing well or get worse. Document Released: 03/26/2005 Document Revised: 12/16/2011 Document Reviewed: 09/01/2011 [employer] Patient Information 2014 [employer].  

## 2013-12-08 NOTE — ED Notes (Signed)
States she has an old bruise on her lower left leg x couple of years, and yeterdady, she bumped her leg on a computer mat when she has moving chair, w resultant injury causing additional discolor ation on her lower leg w a circumference type defect . Denies pain, good dorsal pedal pulse. On coumadin, and was reportedly advised by her MD to go to an urgent care for evaluation of her problem

## 2013-12-08 NOTE — ED Provider Notes (Signed)
CSN: [id number]     Arrival date & time 12/08/13  1027 History   First MD Initiated Contact with Patient 12/08/13 1049     Chief Complaint  Patient presents with  . Leg Problem   (Consider location/radiation/quality/duration/timing/severity/associated sxs/prior Treatment) HPI Comments: Patient presents with increasing redness, swelling and tenderness of LLE over past 48 hours. Denies fever or chills. Is visiting area from [place], [place] and states she is concerned she might be developing cellulitis. States she had a similar experience 3-4 years ago and worries that she needs to begin treatment before she can get home to see her PCP in [place], [place] on 12/12/2013. Also mentions that she takes coumadin and bumped her left lower leg while cleaning yesterday and this is the reason she has a bruise on her left lower leg.  Is immunocompromised secondary to medications used to treat her rheumatoid arthritis.   The history is provided by the patient.    Past Medical History  Diagnosis Date  . Warfarin anticoagulation     Atrial fibrillation  //  held for a period of time 2010 when CNS bleed but then resumed  . Atrial fibrillation     Coumadin use in the past until CNS bleed  . Hypothyroidism   . Rheumatoid arthritis(714.0)     History of surgical procedures  . S/P cholecystectomy     Cholecystectomy and hysterectomy  . Subdural hematoma     Patient fell 2010. She had a temporal hemorrhage and subdural hematoma. Coumadin was reversed and the patient stabilized  . Abnormal chest x-ray     2000  //  repeat chest x-ray 2002 no significant abnormality  . Hypertension     Question of hypertension June, 2013  . Ejection fraction     EF 65%, echo, June, 2013  . Aortic valve sclerosis     Mild, echo, June, 2013  . Aortic insufficiency     Mild, echo, June, 2013  . Mitral regurgitation     Mild to moderate, echo, June, 2013  . Tricuspid regurgitation     Mild to moderate, echo, June, 2013, PA  pressure 40 mm mercury   Past Surgical History  Procedure Laterality Date  . Hand surgery      x5  . Cholecystectomy  '02  . Total abdominal hysterectomy  '84  . Skin cancer excision      multiple   Family History  Problem Relation Age of Onset  . Heart failure    . Heart disease    . Diabetes    . Thyroid cancer Son     about 20 yrs ago   History  Substance Use Topics  . Smoking status: Former Smoker    Quit date: 07/01/1987  . Smokeless tobacco: Never Used  . Alcohol Use: 4.2 oz/week    7 Glasses of wine per week   OB History   Grav Para Term Preterm Abortions TAB SAB Ect Mult Living                 Review of Systems  All other systems reviewed and are negative.   Allergies  Review of patient's allergies indicates no known allergies.  Home Medications   Prior to Admission medications   Medication Sig Start Date End Date Taking? Authorizing Provider  adalimumab (HUMIRA) 40 MG/0.8ML injection Inject 40 mg into the skin every 14 (fourteen) days.   Yes Historical Provider, MD  diltiazem (CARDIZEM CD) 180 MG 24 hr capsule Take 180 mg  by mouth daily.   Yes Historical Provider, MD  estrogens, conjugated, (PREMARIN) 0.3 MG tablet Take 0.3 mg by mouth daily.    Yes Historical Provider, MD  ezetimibe-simvastatin (VYTORIN) 10-40 MG per tablet Take 1 tablet by mouth at bedtime.   Yes Historical Provider, MD  hydrochlorothiazide (HYDRODIURIL) 25 MG tablet Take 25 mg by mouth daily.   Yes Historical Provider, MD  leflunomide (ARAVA) 20 MG tablet Take 20 mg by mouth daily.   Yes Historical Provider, MD  metoprolol succinate (TOPROL-XL) 25 MG 24 hr tablet Take 50 mg by mouth 2 (two) times daily.   Yes Historical Provider, MD  warfarin (COUMADIN) 2 MG tablet Take 2 mg by mouth as directed.   Yes Historical Provider, MD  dicloxacillin (DYNAPEN) 500 MG capsule Take 1 capsule (500 mg total) by mouth 4 (four) times daily. X 7 days 12/08/13   Ardis RowanJennifer Routon Kim Lauver, PA  levothyroxine  (SYNTHROID, LEVOTHROID) 88 MCG tablet Take 88 mcg by mouth daily.    Historical Provider, MD   BP 184/90  Pulse 90  Temp(Src) 98.1 F (36.7 C) (Oral)  Resp 18  SpO2 97% Physical Exam  Nursing note and vitals reviewed. Constitutional: She is oriented to person, place, and time. She appears well-developed and well-nourished. No distress.  HENT:  Head: Normocephalic and atraumatic.  Eyes: Conjunctivae are normal.  Cardiovascular: Normal rate.   Pulmonary/Chest: Effort normal.  Musculoskeletal: Normal range of motion.  Neurological: She is alert and oriented to person, place, and time.  Skin: Skin is warm and dry.  Circumferential erythema and induration with mild STS at left lower leg. Distal pulses and sensation intact.   Psychiatric: She has a normal mood and affect. Her behavior is normal.    ED Course  Procedures (including critical care time) Labs Review Labs Reviewed - No data to display  Imaging Review No results found.   MDM   1. Cellulitis of left lower extremity    Recommended elevation and warm compresses TID-QID along with dicloxacillin as prescribed and close follow up with her PCP upon returning home.     Jess BartersJennifer Lysaght Pine RiverPresson, [place] 12/08/13 1148

## 2013-12-08 NOTE — Telephone Encounter (Signed)
Pt walked in; pt no longer lives in this area but visiting until 12/11/13 and last night noticed red area on lt lower leg from ankle to mid calf. Pt has hx of cellulitis. Pt spoke with PCP in [place] and was advised to see dr here. No available appts this AM';spoke with Carlena Sax RN team lead and she advised pt should be seen at [facility]. Pt will go to [facility].

## 2013-12-14 NOTE — ED Provider Notes (Signed)
Medical screening examination/treatment/procedure(s) were performed by a resident physician or non-physician practitioner and as the supervising physician I was immediately available for consultation/collaboration.  Gennifer Potenza, MD    Lesa Vandall S Decklin Weddington, MD 12/14/13 0735 

## 2014-03-22 ENCOUNTER — Encounter: Payer: Self-pay | Admitting: Internal Medicine

## 2014-04-20 ENCOUNTER — Encounter: Payer: Self-pay | Admitting: Internal Medicine

## 2014-09-26 ENCOUNTER — Telehealth: Payer: Self-pay | Admitting: Internal Medicine

## 2014-09-26 NOTE — Telephone Encounter (Signed)
Pt states she had some stomach discomfort last week, she ate a kale salad with some cabbage and she thinks it caused lots of gas. Pt states after that she was constipated and she took miralax and gasx that her pharmacist suggested. Pt states she had a very good BM but she was concerned because normally she has a bm daily without having to take anything. Discussed with pt to monitor it for now and if she continues to have problems with constipation to call us back. Pt verbalized understanding.

## 2017-08-26 ENCOUNTER — Other Ambulatory Visit: Payer: Self-pay

## 2017-08-26 ENCOUNTER — Emergency Department (HOSPITAL_COMMUNITY): Payer: Medicare Other

## 2017-08-26 ENCOUNTER — Emergency Department (HOSPITAL_COMMUNITY)
Admission: EM | Admit: 2017-08-26 | Discharge: 2017-08-26 | Disposition: A | Discharge status: Home / Self Care | Payer: Medicare Other | Attending: Emergency Medicine | Admitting: Emergency Medicine

## 2017-08-26 ENCOUNTER — Encounter (HOSPITAL_COMMUNITY): Payer: Self-pay | Admitting: *Deleted

## 2017-08-26 DIAGNOSIS — I10 Essential (primary) hypertension: Secondary | ICD-10-CM | POA: Insufficient documentation

## 2017-08-26 DIAGNOSIS — I4891 Unspecified atrial fibrillation: Secondary | ICD-10-CM | POA: Insufficient documentation

## 2017-08-26 DIAGNOSIS — W0110XA Fall on same level from slipping, tripping and stumbling with subsequent striking against unspecified object, initial encounter: Secondary | ICD-10-CM | POA: Diagnosis not present

## 2017-08-26 DIAGNOSIS — Y939 Activity, unspecified: Secondary | ICD-10-CM | POA: Insufficient documentation

## 2017-08-26 DIAGNOSIS — E039 Hypothyroidism, unspecified: Secondary | ICD-10-CM | POA: Diagnosis not present

## 2017-08-26 DIAGNOSIS — Z7901 Long term (current) use of anticoagulants: Secondary | ICD-10-CM | POA: Diagnosis not present

## 2017-08-26 DIAGNOSIS — Y929 Unspecified place or not applicable: Secondary | ICD-10-CM | POA: Insufficient documentation

## 2017-08-26 DIAGNOSIS — S0990XA Unspecified injury of head, initial encounter: Secondary | ICD-10-CM | POA: Insufficient documentation

## 2017-08-26 DIAGNOSIS — Z79899 Other long term (current) drug therapy: Secondary | ICD-10-CM | POA: Insufficient documentation

## 2017-08-26 DIAGNOSIS — Y999 Unspecified external cause status: Secondary | ICD-10-CM | POA: Diagnosis not present

## 2017-08-26 DIAGNOSIS — Z87891 Personal history of nicotine dependence: Secondary | ICD-10-CM | POA: Insufficient documentation

## 2017-08-26 NOTE — ED Triage Notes (Signed)
PT denies any vision changes 

## 2017-08-26 NOTE — ED Provider Notes (Signed)
[facility] EMERGENCY DEPARTMENT Provider Note   CSN: [id number] Arrival date & time: 08/26/17  1014     History   Chief Complaint Chief Complaint  Patient presents with  . Fall    HPI Marissa Jackson is a 84 y.o. female.  HPI   84 year old female presents today with fall.  Patient notes that she had a mechanical fall slipping today she fell forward and hit the front part of her head.  She notes minor bleeding and pain of the anterior nose, superficial abrasions to the hand and minor epistaxis has now stopped.  Patient reports that consciousness, did not pass out prior to falling, denies any neck pain, headache, neurological deficits, chest hip or any other musculoskeletal pain.  She denies any decreased sensation strength or motor function in the extremities.  She reports that she is on Coumadin but it is subtherapeutic as noted yesterday on her INR.  Patient reports she takes Coumadin for atrial fibrillation.  She reports she is able to breathe through both nostrils, denies any face pain other than over the bridge of the nose.  Dentition within normal limits full active pain-free range of motion of the jaw.    Past Medical History:  Diagnosis Date  . Abnormal chest x-ray    2000  //  repeat chest x-ray 2002 no significant abnormality  . Aortic insufficiency    Mild, echo, June, 2013  . Aortic valve sclerosis    Mild, echo, June, 2013  . Atrial fibrillation (HCC)    Coumadin use in the past until CNS bleed  . Ejection fraction    EF 65%, echo, June, 2013  . Hypertension    Question of hypertension June, 2013  . Hypothyroidism   . Mitral regurgitation    Mild to moderate, echo, June, 2013  . Rheumatoid arthritis(714.0)    History of surgical procedures  . S/P cholecystectomy    Cholecystectomy and hysterectomy  . Subdural hematoma (HCC)    Patient fell 2010. She had a temporal hemorrhage and subdural hematoma. Coumadin was reversed and the patient  stabilized  . Tricuspid regurgitation    Mild to moderate, echo, June, 2013, PA pressure 40 mm mercury  . Warfarin anticoagulation    Atrial fibrillation  //  held for a period of time 2010 when CNS bleed but then resumed    Patient Active Problem List   Diagnosis Date Noted  . Ejection fraction   . Aortic valve sclerosis   . Aortic insufficiency   . Mitral regurgitation   . Tricuspid regurgitation   . Abnormal chest x-ray   . Warfarin anticoagulation   . Hypertension   . Hypothyroidism   . Rheumatoid arthritis(714.0)   . S/P cholecystectomy   . Subdural hematoma (HCC)   . Atrial fibrillation (HCC)   . UTI 05/21/2007    Past Surgical History:  Procedure Laterality Date  . CHOLECYSTECTOMY  '02  . HAND SURGERY     x5  . SKIN CANCER EXCISION     multiple  . TOTAL ABDOMINAL HYSTERECTOMY  '84    OB History    No data available       Home Medications    Prior to Admission medications   Medication Sig Start Date End Date Taking? Authorizing Provider  adalimumab (HUMIRA) 40 MG/0.8ML injection Inject 40 mg into the skin every 14 (fourteen) days.    [provider]  dicloxacillin (DYNAPEN) 500 MG capsule Take 1 capsule (500 mg total) by  mouth 4 (four) times daily. X 7 days 12/08/13   Ria Clock, PA  diltiazem (CARDIZEM CD) 180 MG 24 hr capsule Take 180 mg by mouth daily.    [provider]  estrogens, conjugated, (PREMARIN) 0.3 MG tablet Take 0.3 mg by mouth daily.     [provider]  ezetimibe-simvastatin (VYTORIN) 10-40 MG per tablet Take 1 tablet by mouth at bedtime.    [provider]  hydrochlorothiazide (HYDRODIURIL) 25 MG tablet Take 25 mg by mouth daily.    [provider]  leflunomide (ARAVA) 20 MG tablet Take 20 mg by mouth daily.    [provider]  levothyroxine (SYNTHROID, LEVOTHROID) 88 MCG tablet Take 88 mcg by mouth daily.    [provider]  metoprolol succinate (TOPROL-XL) 25 MG 24  hr tablet Take 50 mg by mouth 2 (two) times daily.    [provider]  warfarin (COUMADIN) 2 MG tablet Take 2 mg by mouth as directed.    [provider]    Family History Family History  Problem Relation Age of Onset  . Heart failure Unknown   . Heart disease Unknown   . Diabetes Unknown   . Thyroid cancer Son        about 20 yrs ago    Social History Social History   Tobacco Use  . Smoking status: Former Smoker    Last attempt to quit: 07/01/1987    Years since quitting: 30.1  . Smokeless tobacco: Never Used  Substance Use Topics  . Alcohol use: Yes    Alcohol/week: 4.2 oz    Types: 7 Glasses of wine per week  . Drug use: No     Allergies   Patient has no known allergies.   Review of Systems Review of Systems  All other systems reviewed and are negative.    Physical Exam Updated Vital Signs BP (!) 167/70 (BP Location: Right Arm)   Pulse 60   Temp 98.4 F (36.9 C) (Oral)   Resp 16   SpO2 100%   Physical Exam  Constitutional: She is oriented to person, place, and time. She appears well-developed and well-nourished.  HENT:  Head: Normocephalic and atraumatic.  Skin loss over the bridge of nose-no septal deviation, no open fracture, nares patent bilateral no septal hematoma dried blood in the naris, no tenderness to palpation of the face, neck supple full active range of motion nontender to palpation extraocular movements are intact and pain-free jaw full active range of motion no pain with movement, dentition within normal limits with normal alignment  Eyes: Conjunctivae are normal. Pupils are equal, round, and reactive to light. Right eye exhibits no discharge. Left eye exhibits no discharge. No scleral icterus.  Neck: Normal range of motion. No JVD present. No tracheal deviation present.  Pulmonary/Chest: Effort normal. No stridor.  Neurological: She is alert and oriented to person, place, and time. Coordination normal.  Psychiatric: She has  a normal mood and affect. Her behavior is normal. Judgment and thought content normal.  Nursing note and vitals reviewed.    ED Treatments / Results  Labs (all labs ordered are listed, but only abnormal results are displayed) Labs Reviewed - No data to display  EKG  EKG Interpretation None       Radiology Ct Head Wo Contrast  Result Date: 08/26/2017 CLINICAL DATA:  The patient tripped on a bench today resulting in a fall and head injury. Initial encounter. EXAM: CT HEAD WITHOUT CONTRAST TECHNIQUE:  Contiguous axial images were obtained from the base of the skull through the vertex without intravenous contrast. COMPARISON:  Head CT scan 06/17/2009. FINDINGS: Brain: No evidence of acute infarction, hemorrhage, hydrocephalus, extra-axial collection or mass lesion/mass effect. Fairly extensive chronic microvascular ischemic change is noted. Vascular: No hyperdense vessel or unexpected calcification. Skull: Intact. Sinuses/Orbits: No acute abnormality.  Status post lens extraction. Other: None. IMPRESSION: No acute abnormality. Chronic microvascular ischemic change. Electronically Signed   By: Drusilla Kannerhomas  Dalessio M.D.   On: 08/26/2017 13:58    Procedures Procedures (including critical care time)  Medications Ordered in ED Medications - No data to display   Initial Impression / Assessment and Plan / ED Course  I have reviewed the triage vital signs and the nursing notes.  Pertinent labs & imaging results that were available during my care of the patient were reviewed by me and considered in my medical decision making (see chart for details).      Final Clinical Impressions(s) / ED Diagnoses   Final diagnoses:  Injury of head, initial encounter    Labs:   Imaging: CT head without contrast  Consults:  Therapeutics:  Discharge Meds:   Assessment/Plan: 84 year old female presents status post fall.  Patient is on Coumadin, head CT without acute abnormalities.  Superficial  abrasions noted.  Patient's tetanus is up-to-date.  Patient does have trauma to the nose, nare is patent bilateral no septal hematoma, no epistaxis no midface tenderness, dentition within normal limits.  Patient safe for outpatient follow-up, strict return precautions given.  Patient verbalized understanding and agreed    ED Discharge Orders    None       Rosalio LoudHedges, Chesney Suares, PA-C 08/26/17 1612    Rolland PorterJames, Mark, MD 08/27/17 2208

## 2017-08-26 NOTE — ED Triage Notes (Signed)
Pt arrived by [facility], was walking and tripped on a bench and had a fall. Pt landed on cement. No loc. Pt reports +blood thinners. Has abrasion to nose and left hand. Bleeding controlled on arrival.

## 2017-08-26 NOTE — ED Provider Notes (Signed)
Patient seen and evaluated.  Discussed with Burna FortsJeff Hedges, PA-C.  Patient with minimal laceration abrasion over bridge of nose.  No malocclusion or dental trauma.  Some ecchymosis over the upper lip over the maxilla.  No no pain with bite.  CT scan shows no intracerebral hemorrhage.  She is on Coumadin for persistent A. fib.  Will hold Coumadin tonight and then restart tomorrow.  Told her to expect additional ecchymosis and perhaps bilateral periorbital ecchymosis.  Ice, Tylenol, recheck here with worsening symptoms.   Rolland PorterJames, Keyosha Tiedt, MD 08/26/17 [id number]

## 2017-08-26 NOTE — Discharge Instructions (Signed)
Please read attached information. If you experience any new or worsening signs or symptoms please return to the emergency room for evaluation. Please follow-up with your primary care provider or specialist as discussed. Please use medication prescribed only as directed and discontinue taking if you have any concerning signs or symptoms.   °

## 2020-04-26 ENCOUNTER — Other Ambulatory Visit: Payer: Self-pay | Admitting: Rheumatology

## 2020-04-26 DIAGNOSIS — M0579 Rheumatoid arthritis with rheumatoid factor of multiple sites without organ or systems involvement: Secondary | ICD-10-CM

## 2020-04-26 DIAGNOSIS — R2231 Localized swelling, mass and lump, right upper limb: Secondary | ICD-10-CM

## 2020-05-01 ENCOUNTER — Ambulatory Visit
Admission: RE | Admit: 2020-05-01 | Discharge: 2020-05-01 | Disposition: A | Discharge status: Home / Self Care | Payer: Medicare Other | Source: Ambulatory Visit | Attending: Rheumatology | Admitting: Rheumatology

## 2020-05-01 ENCOUNTER — Other Ambulatory Visit: Payer: Self-pay

## 2020-05-01 DIAGNOSIS — M0579 Rheumatoid arthritis with rheumatoid factor of multiple sites without organ or systems involvement: Secondary | ICD-10-CM | POA: Insufficient documentation

## 2020-05-01 DIAGNOSIS — R2231 Localized swelling, mass and lump, right upper limb: Secondary | ICD-10-CM | POA: Diagnosis present

## 2020-05-01 HISTORY — DX: Systemic involvement of connective tissue, unspecified: M35.9

## 2020-05-01 LAB — POCT I-STAT CREATININE: Creatinine, Ser: 0.9 mg/dL (ref 0.44–1.00)

## 2020-05-01 MED ORDER — IOHEXOL 300 MG/ML  SOLN
75.0000 mL | Freq: Once | INTRAMUSCULAR | Status: AC | PRN
  Administered 2020-05-01: 75 mL via INTRAVENOUS

## 2021-11-10 ENCOUNTER — Emergency Department (HOSPITAL_BASED_OUTPATIENT_CLINIC_OR_DEPARTMENT_OTHER): Payer: Medicare Other

## 2021-11-10 ENCOUNTER — Other Ambulatory Visit: Payer: Self-pay

## 2021-11-10 ENCOUNTER — Encounter (HOSPITAL_BASED_OUTPATIENT_CLINIC_OR_DEPARTMENT_OTHER): Payer: Self-pay | Admitting: Emergency Medicine

## 2021-11-10 ENCOUNTER — Emergency Department (HOSPITAL_BASED_OUTPATIENT_CLINIC_OR_DEPARTMENT_OTHER)
Admission: EM | Admit: 2021-11-10 | Discharge: 2021-11-10 | Disposition: A | Discharge status: Home / Self Care | Payer: Medicare Other | Attending: Emergency Medicine | Admitting: Emergency Medicine

## 2021-11-10 DIAGNOSIS — Z859 Personal history of malignant neoplasm, unspecified: Secondary | ICD-10-CM | POA: Insufficient documentation

## 2021-11-10 DIAGNOSIS — L0390 Cellulitis, unspecified: Secondary | ICD-10-CM

## 2021-11-10 DIAGNOSIS — L03116 Cellulitis of left lower limb: Secondary | ICD-10-CM | POA: Diagnosis not present

## 2021-11-10 DIAGNOSIS — Z7901 Long term (current) use of anticoagulants: Secondary | ICD-10-CM | POA: Diagnosis not present

## 2021-11-10 DIAGNOSIS — M7989 Other specified soft tissue disorders: Secondary | ICD-10-CM | POA: Diagnosis present

## 2021-11-10 LAB — PROTIME-INR
INR: 2.9 — ABNORMAL HIGH (ref 0.8–1.2)
Prothrombin Time: 30.3 seconds — ABNORMAL HIGH (ref 11.4–15.2)

## 2021-11-10 MED ORDER — CLINDAMYCIN HCL 300 MG PO CAPS: 300.0000 mg | ORAL_CAPSULE | Freq: Three times a day (TID) | ORAL | 0 refills | Status: AC

## 2021-11-10 NOTE — ED Notes (Signed)
US in progress at bedside.

## 2021-11-10 NOTE — ED Provider Notes (Signed)
?[facility] EMERGENCY DEPARTMENT ?Provider Note ? ? ?CSN: [id number] ?Arrival date & time: 11/10/21  7989 ? ?  ? ?History ? ?Chief Complaint  ?Patient presents with  ? Leg Pain  ? ? ?Marissa Jackson is a 88 y.o. female. ? ?Patient is here with concern for cellulitis on her legs.  Patient is on Coumadin.  Primary care doctor told her to come for ultrasound to rule out DVT.  She states that her INR has been at goal.  Has never been below 2.  She has redness to the left lower leg with some additional swelling.  Has some cancer cells removed from her leg several weeks ago.  Denies any numbness, weakness, shortness of breath, chest pain. ? ? ? ?  ? ?Home Medications ?Prior to Admission medications   ?Medication Sig Start Date End Date Taking? Authorizing Provider  ?clindamycin (CLEOCIN) 300 MG capsule Take 1 capsule (300 mg total) by mouth 3 (three) times daily for 7 days. 11/10/21 11/17/21 Yes Razia Screws, DO  ?adalimumab (HUMIRA) 40 MG/0.8ML injection Inject 40 mg into the skin every 14 (fourteen) days.    [provider]  ?dicloxacillin (DYNAPEN) 500 MG capsule Take 1 capsule (500 mg total) by mouth 4 (four) times daily. X 7 days 12/08/13   Ria Clock, PA  ?diltiazem (CARDIZEM CD) 180 MG 24 hr capsule Take 180 mg by mouth daily.    [provider]  ?estrogens, conjugated, (PREMARIN) 0.3 MG tablet Take 0.3 mg by mouth daily.     [provider]  ?ezetimibe-simvastatin (VYTORIN) 10-40 MG per tablet Take 1 tablet by mouth at bedtime.    [provider]  ?hydrochlorothiazide (HYDRODIURIL) 25 MG tablet Take 25 mg by mouth daily.    [provider]  ?leflunomide (ARAVA) 20 MG tablet Take 20 mg by mouth daily.    [provider]  ?levothyroxine (SYNTHROID, LEVOTHROID) 88 MCG tablet Take 88 mcg by mouth daily.    [provider]  ?metoprolol succinate (TOPROL-XL) 25 MG 24 hr tablet Take 50 mg by mouth 2 (two) times daily.    [provider]  ?warfarin (COUMADIN) 2 MG tablet Take 2 mg by mouth as directed.    [provider]  ?   ? ?Allergies    ?Patient has no known allergies.   ? ?Review of Systems   ?Review of Systems ? ?Physical Exam ?Updated Vital Signs ?BP 129/63 (BP Location: Right Arm)   Pulse 93   Temp 97.6 ?F (36.4 ?C) (Oral)   Resp 16   Ht 5\' 3"  (1.6 m)   SpO2 98%   BMI 25.51 kg/m?  ?Physical Exam ?Vitals and nursing note reviewed.  ?Constitutional:   ?   General: She is not in acute distress. ?   Appearance: She is well-developed.  ?HENT:  ?   Head: Normocephalic and atraumatic.  ?   Nose: Nose normal.  ?   Mouth/Throat:  ?   Mouth: Mucous membranes are moist.  ?Eyes:  ?   Extraocular Movements: Extraocular movements intact.  ?   Conjunctiva/sclera: Conjunctivae normal.  ?   Pupils: Pupils are equal, round, and reactive to light.  ?Cardiovascular:  ?   Rate and Rhythm: Normal rate and regular rhythm.  ?   Pulses: Normal pulses.  ?   Heart sounds: No murmur heard. ?Pulmonary:  ?   Effort: Pulmonary effort is normal. No respiratory distress.  ?   Breath sounds: Normal breath sounds.  ?  Abdominal:  ?   Palpations: Abdomen is soft.  ?   Tenderness: There is no abdominal tenderness.  ?Musculoskeletal:     ?   General: No swelling.  ?   Cervical back: Neck supple.  ?   Left lower leg: Edema present.  ?Skin: ?   General: Skin is warm and dry.  ?   Capillary Refill: Capillary refill takes less than 2 seconds.  ?   Findings: Erythema present.  ?Neurological:  ?   Mental Status: She is alert.  ?Psychiatric:     ?   Mood and Affect: Mood normal.  ? ? ?ED Results / Procedures / Treatments   ?Labs ?(all labs ordered are listed, but only abnormal results are displayed) ?Labs Reviewed  ?PROTIME-INR - Abnormal; Notable for the following components:  ?    Result Value  ? Prothrombin Time 30.3 (*)   ? INR 2.9 (*)   ? All other components within normal limits  ? ? ?EKG ?None ? ?Radiology ?US Venous Img Lower  Left (DVT Study) ? ?Result Date:  11/10/2021 ?CLINICAL DATA:  LEFT LEG SWELLING and leaking fluid. EXAM: Left LOWER EXTREMITY VENOUS DOPPLER ULTRASOUND TECHNIQUE: Gray-scale sonography with graded compression, as well as color Doppler and duplex ultrasound were performed to evaluate the lower extremity deep venous systems from the level of the common femoral vein and including the common femoral, femoral, profunda femoral, popliteal and calf veins including the posterior tibial, peroneal and gastrocnemius veins when visible. The superficial great saphenous vein was also interrogated. Spectral Doppler was utilized to evaluate flow at rest and with distal augmentation maneuvers in the common femoral, femoral and popliteal veins. COMPARISON:  None Available. FINDINGS: Contralateral Common Femoral Vein: Respiratory phasicity is normal and symmetric with the symptomatic side. No evidence of thrombus. Normal compressibility. Common Femoral Vein: No evidence of acute occlusive thrombus. There is a linear band of echogenicity which is not compressive and not occlusive. This is likely a fibrous band or old thrombus. Saphenofemoral Junction: No evidence of thrombus. Normal compressibility and flow on color Doppler imaging. Profunda Femoral Vein: No evidence of thrombus. Normal compressibility and flow on color Doppler imaging. Femoral Vein: No evidence of thrombus. Normal compressibility, respiratory phasicity and response to augmentation. Popliteal Vein: No evidence of thrombus. Normal compressibility, respiratory phasicity and response to augmentation. Calf Veins: No evidence of thrombus. Normal compressibility and flow on color Doppler imaging. Superficial Great Saphenous Vein: No evidence of thrombus. Normal compressibility. Venous Reflux:  None. Other Findings:  None. IMPRESSION: Probable fibrous band or chronic nonocclusive clot in the left common femoral vein. No findings for acute occlusive deep venous thrombosis. Electronically Signed   By: Rudie Meyer M.D.   On: 11/10/2021 11:11   ? ?Procedures ?Procedures  ? ? ?Medications Ordered in ED ?Medications - No data to display ? ?ED Course/ Medical Decision Making/ A&P ?  ?                        ?Medical Decision Making ?Amount and/or Complexity of Data Reviewed ?Labs: ordered. ? ?Risk ?Prescription drug management. ? ? ?EMMAGENE ORTNER is here with left lower leg swelling.  My suspicion is for cellulitis but will get ultrasound to rule out DVT.  She is got good pulses and no concern for arterial process.  Normal vitals.  She is on Coumadin and at goal but sent here for DVT study.  We will get DVT study and if negative  likely treat for infection. ? ?DVT shows may be fibrous band or chronic nonocclusive clot in the left common femoral vein but there is no evidence of acute DVT.  Overall suspect may be inflammatory process or infectious process.  Will prescribe antibiotics and have her follow-up with primary care doctor.  Discharged in good condition. ? ?This chart was dictated using voice recognition software.  Despite best efforts to proofread,  errors can occur which can change the documentation meaning.  ? ? ? ? ? ? ? ?Final Clinical Impression(s) / ED Diagnoses ?Final diagnoses:  ?Cellulitis, unspecified cellulitis site  ? ? ?Rx / DC Orders ?ED Discharge Orders   ? ?      Ordered  ?  clindamycin (CLEOCIN) 300 MG capsule  3 times daily       ? 11/10/21 1007  ? ?  ?  ? ?  ? ? ?  ?Virgina NorfolkCuratolo, Naftuli Dalsanto, DO ?11/10/21 1115 ? ?

## 2021-11-10 NOTE — ED Triage Notes (Signed)
Patient states that she is here today to rule out a blood clot. States that she had some cancer cells removed from her leg 6 weeks ago. States that her leg had some swelling then,but that it has gotten bigger now, Left leg is has drainage coming from backside . Leg is red.States doctor to her to come here to rule out blood clot ?

## 2021-11-28 DEATH — deceased
